# Patient Record
Sex: Male | Born: 1970 | Hispanic: No | Marital: Single | Smoking: Never smoker
Health system: Southern US, Community
[De-identification: ages and names within clinical notes are randomized; demographics above are authoritative.]

---

## 2013-09-11 ENCOUNTER — Encounter: Payer: Self-pay | Admitting: Internal Medicine

## 2013-09-11 ENCOUNTER — Ambulatory Visit: Payer: Medicaid Other | Attending: Internal Medicine | Admitting: Internal Medicine

## 2013-09-11 ENCOUNTER — Ambulatory Visit: Payer: Self-pay | Admitting: Internal Medicine

## 2013-09-11 VITALS — BP 134/88 | HR 77 | Temp 98.0°F | Resp 16 | Wt 125.2 lb

## 2013-09-11 DIAGNOSIS — H6120 Impacted cerumen, unspecified ear: Secondary | ICD-10-CM

## 2013-09-11 DIAGNOSIS — Z0000 Encounter for general adult medical examination without abnormal findings: Secondary | ICD-10-CM | POA: Diagnosis not present

## 2013-09-11 DIAGNOSIS — H6122 Impacted cerumen, left ear: Secondary | ICD-10-CM

## 2013-09-11 LAB — CBC WITH DIFFERENTIAL/PLATELET
BASOS PCT: 1 % (ref 0–1)
Basophils Absolute: 0.1 10*3/uL (ref 0.0–0.1)
Eosinophils Absolute: 0.4 10*3/uL (ref 0.0–0.7)
Eosinophils Relative: 7 % — ABNORMAL HIGH (ref 0–5)
HEMATOCRIT: 44.5 % (ref 39.0–52.0)
HEMOGLOBIN: 15.7 g/dL (ref 13.0–17.0)
LYMPHS ABS: 1.8 10*3/uL (ref 0.7–4.0)
Lymphocytes Relative: 28 % (ref 12–46)
MCH: 29.3 pg (ref 26.0–34.0)
MCHC: 35.3 g/dL (ref 30.0–36.0)
MCV: 83 fL (ref 78.0–100.0)
MONO ABS: 0.3 10*3/uL (ref 0.1–1.0)
MONOS PCT: 5 % (ref 3–12)
NEUTROS ABS: 3.8 10*3/uL (ref 1.7–7.7)
Neutrophils Relative %: 59 % (ref 43–77)
Platelets: 159 10*3/uL (ref 150–400)
RBC: 5.36 MIL/uL (ref 4.22–5.81)
RDW: 14.6 % (ref 11.5–15.5)
WBC: 6.4 10*3/uL (ref 4.0–10.5)

## 2013-09-11 LAB — COMPLETE METABOLIC PANEL WITH GFR
ALK PHOS: 66 U/L (ref 39–117)
ALT: 12 U/L (ref 0–53)
AST: 19 U/L (ref 0–37)
Albumin: 4.7 g/dL (ref 3.5–5.2)
BUN: 9 mg/dL (ref 6–23)
CO2: 27 meq/L (ref 19–32)
Calcium: 9.3 mg/dL (ref 8.4–10.5)
Chloride: 104 mEq/L (ref 96–112)
Creat: 0.92 mg/dL (ref 0.50–1.35)
GFR, Est African American: >89 mL/min
GLUCOSE: 129 mg/dL — AB (ref 70–99)
POTASSIUM: 4.2 meq/L (ref 3.5–5.3)
Sodium: 139 mEq/L (ref 135–145)
TOTAL PROTEIN: 7.5 g/dL (ref 6.0–8.3)
Total Bilirubin: 0.6 mg/dL (ref 0.2–1.2)

## 2013-09-11 LAB — LIPID PANEL
Cholesterol: 178 mg/dL (ref 0–200)
HDL: 40 mg/dL (ref >39)
LDL Cholesterol: 105 mg/dL — ABNORMAL HIGH (ref 0–99)
Total CHOL/HDL Ratio: 4.5 Ratio
Triglycerides: 165 mg/dL — ABNORMAL HIGH (ref <150)
VLDL: 33 mg/dL (ref 0–40)

## 2013-09-11 MED ORDER — CARBAMIDE PEROXIDE 6.5 % OT SOLN: 5.0000 [drp] | Freq: Two times a day (BID) | OTIC | Status: DC

## 2013-09-11 NOTE — Progress Notes (Signed)
Interpreter line used Patient here to establish care

## 2013-09-11 NOTE — Progress Notes (Signed)
Patient Demographics  Hector Ramirez, is a 43 y.o. male  ZOX:[id number]  [id number]  DOB - 22-Jun-1970  CC:  Chief Complaint  Patient presents with  . Establish Care       HPI: Hector Ramirez is a 43 y.o. male here today to establish medical care, also wants to have annual health check of her patient denies any acute symptoms denies taking any medications, is not allergic to any medications or food denies smoking cigarettes or drinking alcohol, as per patient he was given a shot not sure whether it was a tetanus shot. Patient has No headache, No chest pain, No abdominal pain - No Nausea, No new weakness tingling or numbness, No Cough - SOB.  No Known Allergies History reviewed. No pertinent past medical history. No current outpatient prescriptions on file prior to visit.   No current facility-administered medications on file prior to visit.   History reviewed. No pertinent family history. History   Social History  . Marital Status: Single    Spouse Name: N/A    Number of Children: N/A  . Years of Education: N/A   Occupational History  . Not on file.   Social History Main Topics  . Smoking status: Never Smoker   . Smokeless tobacco: Not on file  . Alcohol Use: No  . Drug Use: No  . Sexual Activity: Not on file   Other Topics Concern  . Not on file   Social History Narrative  . No narrative on file    Review of Systems: Constitutional: Negative for fever, chills, diaphoresis, activity change, appetite change and fatigue. HENT: Negative for ear pain, nosebleeds, congestion, facial swelling, rhinorrhea, neck pain, neck stiffness and ear discharge.  Eyes: Negative for pain, discharge, redness, itching and visual disturbance. Respiratory: Negative for cough, choking, chest tightness, shortness of breath, wheezing and stridor.  Cardiovascular: Negative for chest pain, palpitations and leg swelling. Gastrointestinal: Negative for abdominal  distention. Genitourinary: Negative for dysuria, urgency, frequency, hematuria, flank pain, decreased urine volume, difficulty urinating and dyspareunia.  Musculoskeletal: Negative for back pain, joint swelling, arthralgia and gait problem. Neurological: Negative for dizziness, tremors, seizures, syncope, facial asymmetry, speech difficulty, weakness, light-headedness, numbness and headaches.  Hematological: Negative for adenopathy. Does not bruise/bleed easily. Psychiatric/Behavioral: Negative for hallucinations, behavioral problems, confusion, dysphoric mood, decreased concentration and agitation.    Objective:   Filed Vitals:   09/11/13 1137  BP: 134/88  Pulse: 77  Temp: 98 F (36.7 C)  Resp: 16    Physical Exam: Constitutional: Patient appears well-developed and well-nourished. No distress. HENT: Normocephalic, atraumatic, External right and left ear normal. Oropharynx is clear and moist. Increased wax in left ear. Eyes: Conjunctivae and EOM are normal. PERRLA, no scleral icterus. Neck: Normal ROM. Neck supple. No JVD. No tracheal deviation. No thyromegaly. CVS: RRR, S1/S2 +, no murmurs, no gallops, no carotid bruit.  Pulmonary: Effort and breath sounds normal, no stridor, rhonchi, wheezes, rales.  Abdominal: Soft. BS +, no distension, tenderness, rebound or guarding.  Musculoskeletal: Normal range of motion. No edema and no tenderness. y Neuro: Alert. Normal reflexes, muscle tone coordination. No cranial nerve deficit. Skin: Skin is warm and dry. No rash noted. Not diaphoretic. No erythema. No pallor. Psychiatric: Normal mood and affect. Behavior, judgment, thought content normal.  No results found for this basename: WBC, HGB, HCT, MCV, PLT   No results found for this basename: CREATININE, BUN, NA, K, CL, CO2    No results found for this basename: HGBA1C  Lipid Panel  No results found for this basename: chol, trig, hdl, cholhdl, vldl, ldlcalc       Assessment and  plan:   1. Well adult health check I have ordered baseline blood work.  - CBC with Differential - COMPLETE METABOLIC PANEL WITH GFR - TSH - Lipid panel - Vit D  25 hydroxy (rtn osteoporosis monitoring)  2. Excess ear wax, left Prescribe Debrox ear drops. - carbamide peroxide (DEBROX) 6.5 % otic solution; Place 5 drops into the left ear 2 (two) times daily.  Dispense: 15 mL; Refill: 1   Return in about 3 months (around 12/12/2013).   Doris CheadleADVANI, Cathlene Gardella, MD

## 2013-09-12 ENCOUNTER — Telehealth: Payer: Self-pay

## 2013-09-12 DIAGNOSIS — H6122 Impacted cerumen, left ear: Secondary | ICD-10-CM

## 2013-09-12 LAB — VITAMIN D 25 HYDROXY (VIT D DEFICIENCY, FRACTURES): VIT D 25 HYDROXY: 24 ng/mL — AB (ref 30–89)

## 2013-09-12 LAB — TSH: TSH: 1.016 u[IU]/mL (ref 0.350–4.500)

## 2013-09-12 MED ORDER — VITAMIN D (ERGOCALCIFEROL) 1.25 MG (50000 UNIT) PO CAPS: 50000.0000 [IU] | ORAL_CAPSULE | ORAL | Status: AC

## 2013-09-12 NOTE — Telephone Encounter (Signed)
Interpreter line used Patient not available Message left on machine to return our call 

## 2013-09-12 NOTE — Telephone Encounter (Signed)
Message copied by Lestine MountJUAREZ, Connee Ikner L on Tue Sep 12, 2013 11:38 AM ------      Message from: Doris CheadleADVANI, DEEPAK      Created: Tue Sep 12, 2013 10:22 AM       Blood work reviewed noticed impaired fasting glucose, call and advise patient for low carbohydrate diet.      also noticed low vitamin D, call patient advise to start ergocalciferol 50,000 units once a week for the duration of  12 weeks.       ------

## 2015-10-20 ENCOUNTER — Ambulatory Visit (HOSPITAL_COMMUNITY)
Admission: EM | Admit: 2015-10-20 | Discharge: 2015-10-20 | Disposition: A | Discharge status: Home / Self Care | Payer: Medicaid Other | Attending: Family Medicine | Admitting: Family Medicine

## 2015-10-20 ENCOUNTER — Encounter (HOSPITAL_COMMUNITY): Payer: Self-pay | Admitting: Family Medicine

## 2015-10-20 ENCOUNTER — Ambulatory Visit (INDEPENDENT_AMBULATORY_CARE_PROVIDER_SITE_OTHER): Payer: Medicaid Other

## 2015-10-20 DIAGNOSIS — R0789 Other chest pain: Secondary | ICD-10-CM

## 2015-10-20 DIAGNOSIS — M7918 Myalgia, other site: Secondary | ICD-10-CM

## 2015-10-20 DIAGNOSIS — S40812A Abrasion of left upper arm, initial encounter: Secondary | ICD-10-CM

## 2015-10-20 DIAGNOSIS — M791 Myalgia: Secondary | ICD-10-CM

## 2015-10-20 MED ORDER — MELOXICAM 7.5 MG PO TABS: 7.5000 mg | ORAL_TABLET | Freq: Every day | ORAL | 0 refills | Status: DC

## 2015-10-20 MED ORDER — MELOXICAM 7.5 MG PO TABS: 7.5000 mg | ORAL_TABLET | Freq: Every day | ORAL | 0 refills | Status: AC

## 2015-10-20 NOTE — ED Notes (Signed)
Dressed laceration with guaze and coban.

## 2015-10-20 NOTE — Discharge Instructions (Signed)
You will be sore for a week or so.  The glue on the left arm will wash off in 4-6 days.
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
## 2016-11-04 ENCOUNTER — Encounter (HOSPITAL_COMMUNITY): Payer: Self-pay | Admitting: *Deleted

## 2016-11-04 ENCOUNTER — Ambulatory Visit (HOSPITAL_COMMUNITY)
Admission: EM | Admit: 2016-11-04 | Discharge: 2016-11-04 | Disposition: A | Discharge status: Home / Self Care | Payer: Self-pay | Attending: Internal Medicine | Admitting: Internal Medicine

## 2016-11-04 DIAGNOSIS — R059 Cough, unspecified: Secondary | ICD-10-CM

## 2016-11-04 DIAGNOSIS — R05 Cough: Secondary | ICD-10-CM

## 2016-11-04 MED ORDER — BENZONATATE 200 MG PO CAPS: 200.0000 mg | ORAL_CAPSULE | Freq: Three times a day (TID) | ORAL | 0 refills | Status: AC | PRN

## 2016-11-04 NOTE — ED Provider Notes (Signed)
[facility]    CSN: [id number] Arrival date & time: 11/04/16  1222  History   Chief Complaint Chief Complaint  Patient presents with  . Cough    HPI Hector Ramirez is a 46 y.o. male.   HPI  Patient presents with cough.   Began two days ago. Non-productive. Has been so bothersome that he was not able to sleep last night. Also reporting subjective fevers, but has not checked his temperature at all. Has taken an OTC liquid medication for cough, and another medication for fever (he does not know the name). Says the medication did improve his fever for a few hours, but then it came back. Denies nasal congestion, rhinorrhea, SOB, wheezing. Says he has itching inside his nose but no congestion. Appetite has been normal. No sick contacts.    History reviewed. No pertinent past medical history.  Patient Active Problem List   Diagnosis Date Noted  . Well adult health check 09/11/2013  . Excess ear wax 09/11/2013    History reviewed. No pertinent surgical history.   Home Medications    Prior to Admission medications   Medication Sig Start Date End Date Taking? Authorizing Provider  benzonatate (TESSALON) 200 MG capsule Take 1 capsule (200 mg total) by mouth 3 (three) times daily as needed for cough. 11/04/16   Marquette Saa, MD  meloxicam (MOBIC) 7.5 MG tablet Take 1 tablet (7.5 mg total) by mouth daily. 10/20/15   Elvina Sidle, MD  Vitamin D, Ergocalciferol, (DRISDOL) 50000 UNITS CAPS capsule Take 1 capsule (50,000 Units total) by mouth every 7 (seven) days. 09/12/13   Doris Cheadle, MD    Family History History reviewed. No pertinent family history.  Social History Social History  Substance Use Topics  . Smoking status: Never Smoker  . Smokeless tobacco: Not on file  . Alcohol use No     Allergies   Patient has no known allergies.   Review of Systems Review of Systems  Constitutional: Positive for fever (Subjective). Negative for appetite  change, chills and fatigue.  HENT: Negative for congestion, rhinorrhea and sore throat.   Respiratory: Positive for cough. Negative for shortness of breath and wheezing.   Gastrointestinal: Negative for constipation, diarrhea, nausea and vomiting.  Neurological: Negative for headaches.   Physical Exam Triage Vital Signs ED Triage Vitals  Enc Vitals Group     BP 11/04/16 1342 132/78     Pulse Rate 11/04/16 1342 82     Resp 11/04/16 1342 18     Temp 11/04/16 1342 98.6 F (37 C)     Temp Source 11/04/16 1342 Oral     SpO2 11/04/16 1342 100 %     Weight --      Height --      Head Circumference --      Peak Flow --      Pain Score 11/04/16 1346 4     Pain Loc --      Pain Edu? --      Excl. in GC? --    No data found.   Updated Vital Signs BP 132/78 (BP Location: Right Arm)   Pulse 82   Temp 98.6 F (37 C) (Oral)   Resp 18   SpO2 100%    Physical Exam  Constitutional: He is oriented to person, place, and time. He appears well-developed and well-nourished. No distress.  HENT:  Head: Normocephalic and atraumatic.  Nose: Nose normal.  Mouth/Throat: Oropharynx is clear and moist. No oropharyngeal exudate.  Eyes: Pupils are equal, round, and reactive to light. Conjunctivae and EOM are normal. Right eye exhibits no discharge. Left eye exhibits no discharge.  Neck: Normal range of motion. Neck supple.  Cardiovascular: Normal rate, regular rhythm and normal heart sounds.   No murmur heard. Pulmonary/Chest: Effort normal and breath sounds normal. No respiratory distress. He has no wheezes.  Abdominal: Soft. Bowel sounds are normal. He exhibits no distension. There is no tenderness.  Musculoskeletal: Normal range of motion.  Lymphadenopathy:    He has no cervical adenopathy.  Neurological: He is alert and oriented to person, place, and time.  Skin: Skin is warm and dry.  Psychiatric: He has a normal mood and affect. His behavior is normal.  Nursing note and vitals  reviewed.  UC Treatments / Results  Labs (all labs ordered are listed, but only abnormal results are displayed) Labs Reviewed - No data to display  EKG  EKG Interpretation None       Radiology No results found.  Procedures Procedures (including critical care time)  Medications Ordered in UC Medications - No data to display   Initial Impression / Assessment and Plan / UC Course  I have reviewed the triage vital signs and the nursing notes.  Pertinent labs & imaging results that were available during my care of the patient were reviewed by me and considered in my medical decision making (see chart for details).    Patient presenting with non-productive cough most consistent with viral etiology. MMM with no oropharyngeal erythema or exudates. Lungs CTAB, normal WOB on RA, speaking in full sentences. Afebrile and non-toxic in appearance. Tessalon perles and honey for cough, ibuprofen/Tylenol for fever.   Final Clinical Impressions(s) / UC Diagnoses   Final diagnoses:  Cough    New Prescriptions Discharge Medication List as of 11/04/2016  2:12 PM    START taking these medications   Details  benzonatate (TESSALON) 200 MG capsule Take 1 capsule (200 mg total) by mouth 3 (three) times daily as needed for cough., Starting Wed 11/04/2016, Print       Tarri Abernethy, MD, MPH PGY-3    Marquette Saa, MD 11/04/16 [phone]

## 2016-11-04 NOTE — ED Triage Notes (Signed)
Fever  And  Cough  Since  Last  Night   Pt   Seems  Hoarse     He  Has  A   Stuffy  Nose  As   Well      Pt is  Sitting  Upright on the  Exam table   Speaking in  Complete   sentances

## 2016-11-04 NOTE — Discharge Instructions (Signed)
It was nice meeting you today Hector Ramirez!  For cough, you can take one of the prescription cough medicine (Tessalon perles) up to every 8 hours as needed. You can also try eating a spoonful of honey, either alone or mixed into a warm beverage, 3-4 times a day.   For fever, you can take ibuprofen or Tylenol up to every 4-6 hours as needed.   If you have any questions or concerns, please feel free to call the clinic.   Be well,  Dr. Natale Milch
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
